# Patient Record
Sex: Female | Born: 1990 | Hispanic: Yes | Marital: Single
Health system: Southern US, Community
[De-identification: ages and names within clinical notes are randomized; demographics above are authoritative.]

---
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
## 2009-02-07 ENCOUNTER — Inpatient Hospital Stay: Payer: Self-pay | Admitting: Obstetrics and Gynecology

## 2010-11-26 ENCOUNTER — Ambulatory Visit: Payer: Self-pay | Admitting: Advanced Practice Midwife

## 2011-05-26 ENCOUNTER — Inpatient Hospital Stay: Payer: Self-pay | Admitting: Obstetrics and Gynecology

## 2011-05-26 LAB — CBC WITH DIFFERENTIAL/PLATELET
Basophil %: 0.2 %
Eosinophil %: 0.2 %
HCT: 38.7 % (ref 35.0–47.0)
HGB: 13.1 g/dL (ref 12.0–16.0)
Lymphocyte %: 17.5 %
Monocyte %: 6.1 %
Neutrophil #: 6.3 10*3/uL (ref 1.4–6.5)
RBC: 4.39 10*6/uL (ref 3.80–5.20)
WBC: 8.3 10*3/uL (ref 3.6–11.0)

## 2011-05-27 LAB — HEMATOCRIT: HCT: 39.8 % (ref 35.0–47.0)

## 2011-06-06 IMAGING — US US OB US >=[ID] SNGL FETUS
1 series · 17 of 28 positions shown · non-contrast
Comparison: none

REASON FOR EXAM: dates
COMMENTS:

[Series 1: us ob us >=(id) sngl fetus · 17 of 48 slices shown]
[im 1/48]
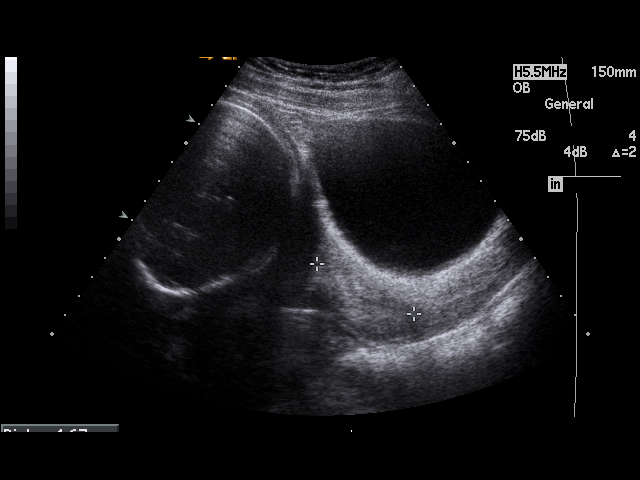
[im 4/48]
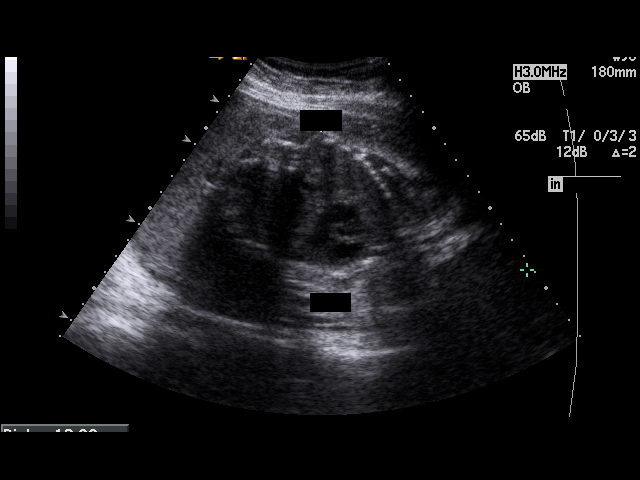
[im 7/48]
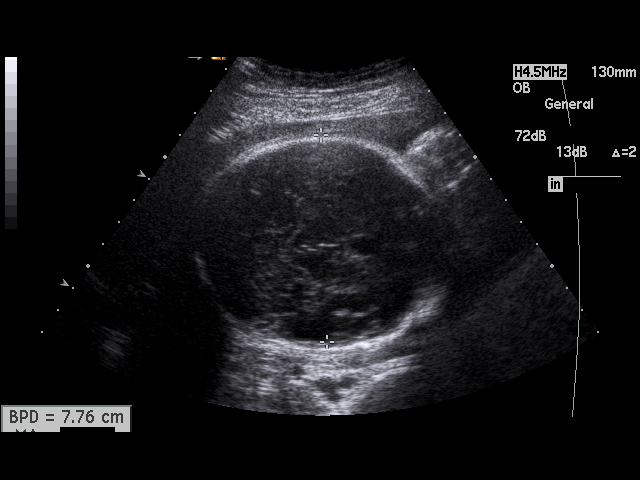
[im 9/48]
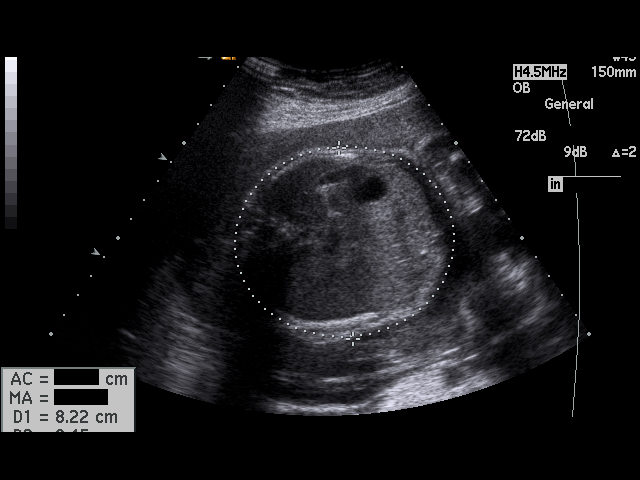
[im 13/48]
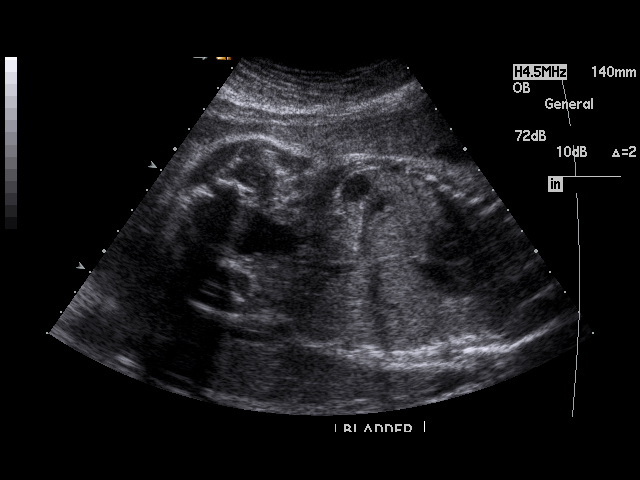
[im 16/48]
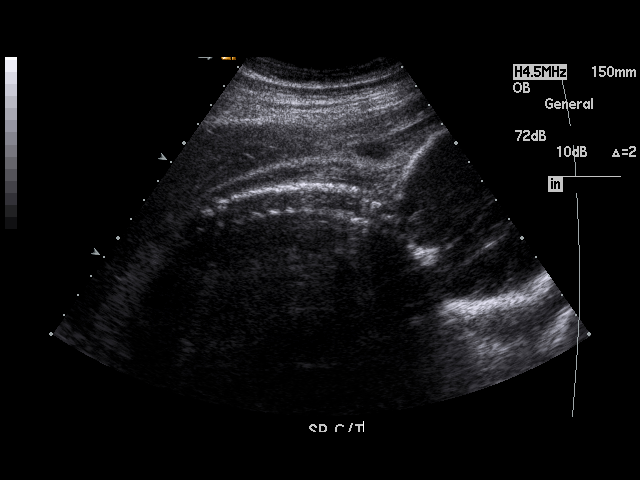
[im 18/48]
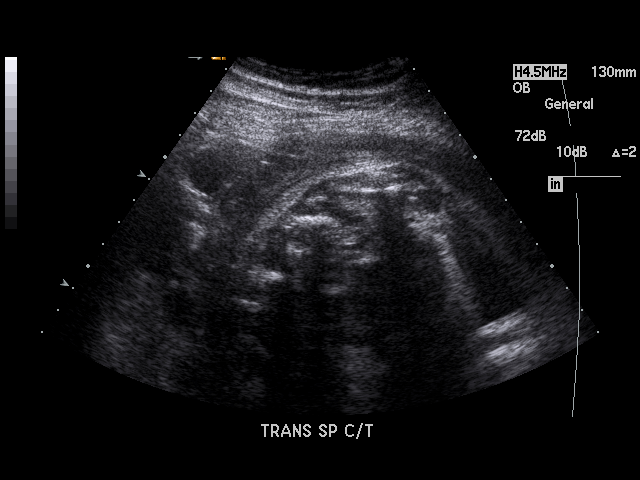
[im 21/48]
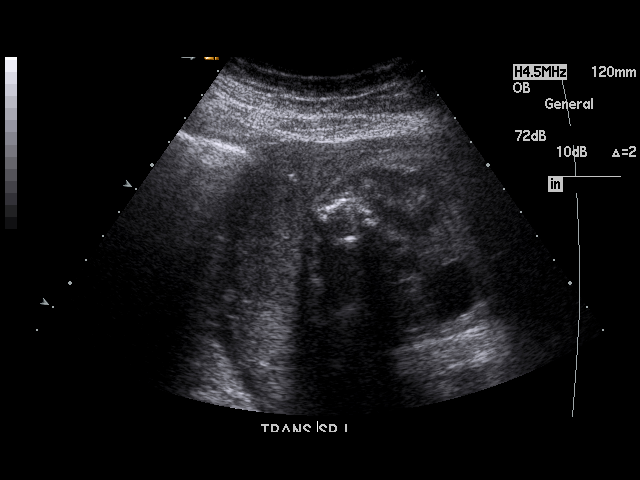
[im 25/48]
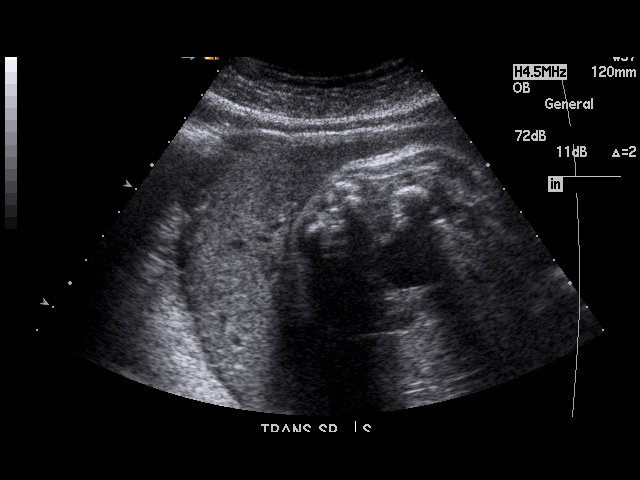
[im 27/48]
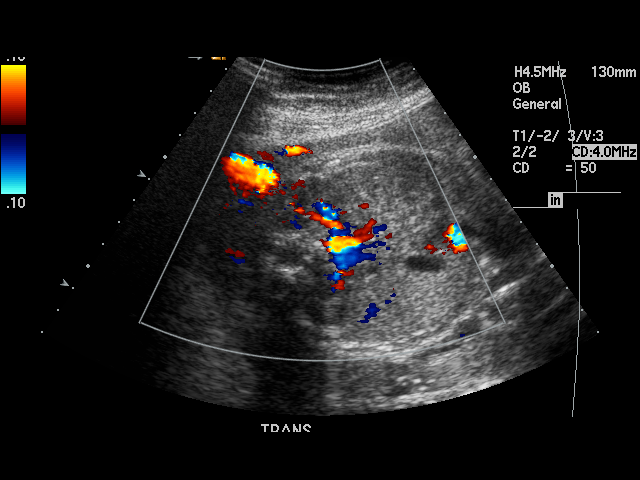
[im 30/48]
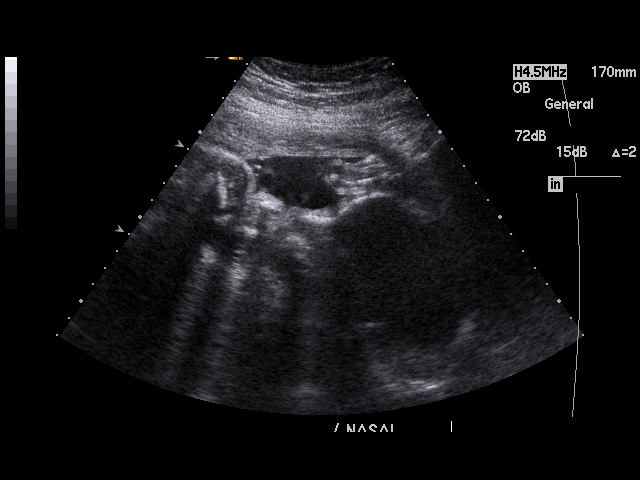
[im 32/48]
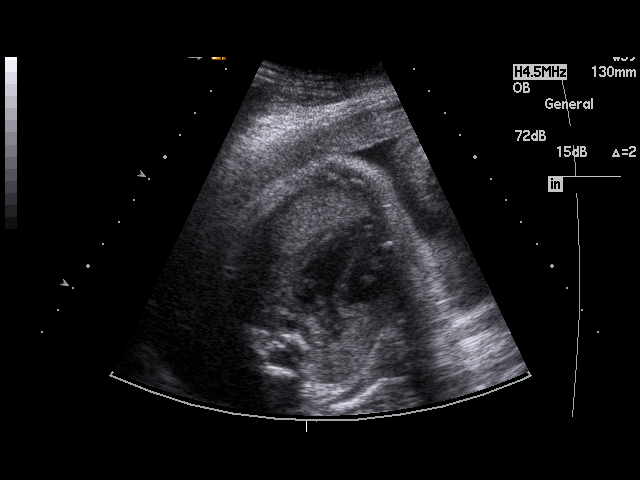
[im 35/48]
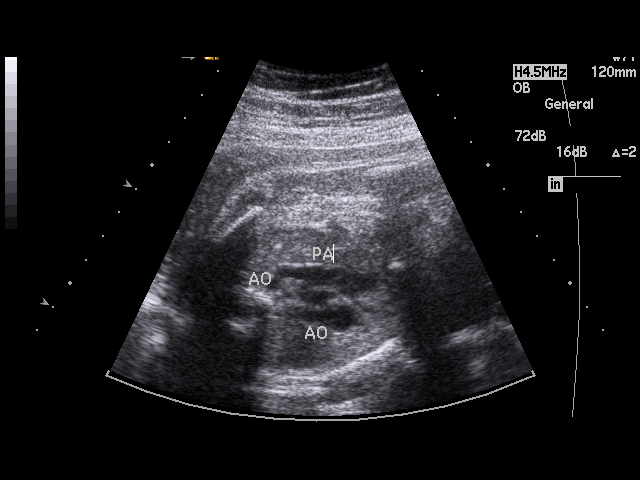
[im 39/48]
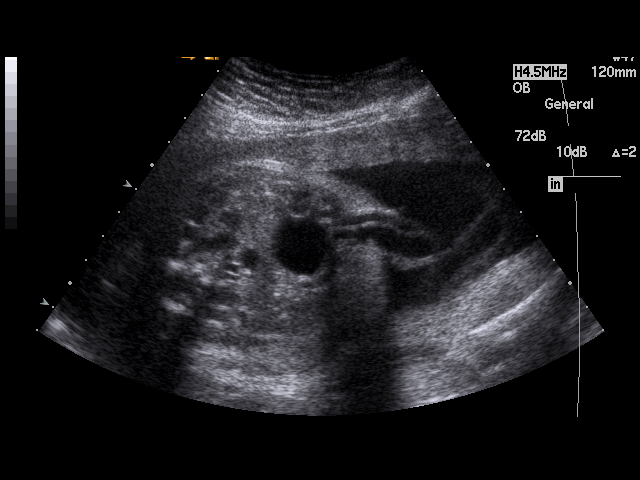
[im 41/48]
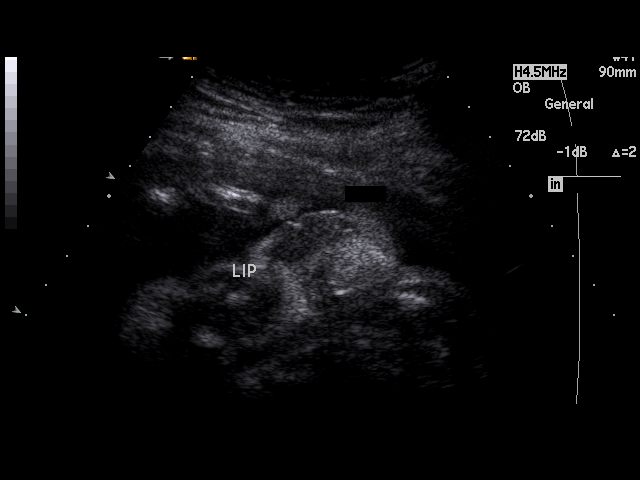
[im 44/48]
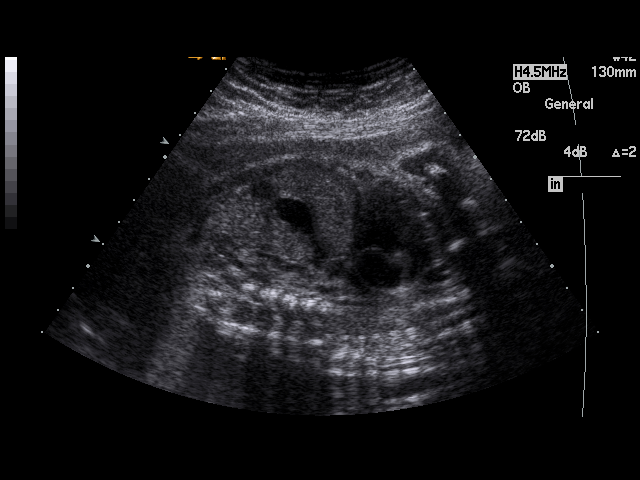
[im 48/48]
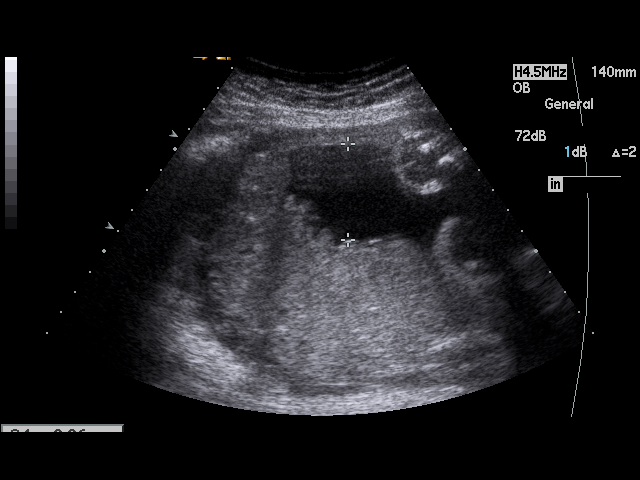

[17 of 28 positions shown; findings below may reference images not displayed]

PROCEDURE:     US  - US OB GREATER/OR EQUAL TO 4UH5G  - December 12, 2008 [DATE]

RESULT:     There is observed a single living intrauterine gestation.
Presentation currently is cephalic. Fetal heart rate was monitored at 150
beats per minute. Amnionic fluid volume appears normal. The placenta is
fundal in location with the inferior margin being approximately 7.5 cm above
the cervix. Cervix length measures 4.67 cm. The fetal heart, stomach, and
urinary bladder are visualized. No hydrocephalus or hydronephrosis is seen.
No fetal abnormalities are observed. Fetal measurements are as follows:

BPD: 7.74 cm corresponding to 31 weeks-5 days
HC: 27.77 cm corresponding to 30 weeks-2 days
AC: 27.14 cm corresponding to 31 weeks-4 days
FL: 6.3 cm corresponding to 32 weeks-4 days

EFW is 1,782 grams. AFI is 12.03 cm which is fairly near the 50th
percentile. Average ultrasound age is 31 weeks-4 days. Ultrasound EDD based
on today's measurements is February 11, 2009.
IMPRESSION: 1.     Please see above.

## 2014-09-02 NOTE — H&P (Signed)
L&D Evaluation:  History Expanded:   HPI 24 yo G2P1001, EDD of 05/22/11 per 14 week US, 6971w4d today. Presents with c/o contractions. Denies LOF or VB. + FM. Cervix 5 cm per RN. PNC at [facility] notable for late entry to care at 15 weeks and GBS in urine. SVD in 2010, "elevated glucose" during pregnancy. Labs: A+, antibody neg, RI, VI, HBsAg neg, HIV NR, RPR NR, GC/CT neg, 1 hour 131    Blood Type A positive    Group B Strep Results (Result >5wks must be treated as unknown) positive  in urine    Maternal HIV Negative    Maternal Syphilis Ab Nonreactive    Maternal Varicella Immune    Rubella Results immune    Maternal T-Dap Immune    Patient's Medical History No Chronic Illness  SVD 02/06/09 6#14oz, no complications    Patient's Surgical History none    Medications Pre Natal Vitamins    Allergies NKDA    Social History none    Family History Non-Contributory   ROS:   ROS see HPI   Exam:   Vital Signs 133/87    General no apparent distress    Mental Status clear    Chest clear    Heart no murmur/gallop/rubs    Abdomen gravid, tender with contractions    Estimated Fetal Weight Average for gestational age    Fetal Position vertex    Edema no edema    Reflexes 2+    Pelvic no external lesions, 5 cm per RN    Mebranes Intact    FHT normal rate with no decels    Ucx regular    Skin dry   Impression:   Impression active labor   Plan:   Plan EFM/NST, antibiotics for GBBS prophylaxis    Comments EFM Category I, pt requesting to ambulate in hallways, will monitor at least 15 min q hour. May have epidural as requested.   Electronic Signatures: Vella KohlerBrothers, Harold Mattes K (CNM)  (Signed 31-Jan-13 12:41)  Authored: L&D Evaluation   Last Updated: 31-Jan-13 12:41 by Vella KohlerBrothers, Tressie Ragin K (CNM)

## 2019-12-09 ENCOUNTER — Ambulatory Visit: Payer: Self-pay | Attending: Internal Medicine

## 2019-12-09 DIAGNOSIS — Z23 Encounter for immunization: Secondary | ICD-10-CM

## 2019-12-09 NOTE — Progress Notes (Signed)
   Covid-19 Vaccination Clinic  Name:  Patricia Vargas    MRN: [id number] DOB: 09-07-1990  12/09/2019  [name] was observed post Covid-19 immunization for 30 minutes based on pre-vaccination screening without incident. She was provided with Vaccine Information Sheet and instruction to access the V-Safe system.   [name] was instructed to call 911 with any severe reactions post vaccine: Marland Kitchen Difficulty breathing  . Swelling of face and throat  . A fast heartbeat  . A bad rash all over body  . Dizziness and weakness   Immunizations Administered    Name Date Dose VIS Date Route   Pfizer COVID-19 Vaccine 12/09/2019  9:45 AM 0.3 mL 06/19/2018 Intramuscular   Manufacturer: [employer], [employer]   Lot: [id number]   NDC: [address]-2

## 2020-01-06 ENCOUNTER — Ambulatory Visit: Payer: Self-pay

## 2020-01-06 ENCOUNTER — Ambulatory Visit: Payer: Medicaid Other | Attending: Internal Medicine

## 2020-01-06 DIAGNOSIS — Z23 Encounter for immunization: Secondary | ICD-10-CM

## 2020-01-06 NOTE — Progress Notes (Signed)
   Covid-19 Vaccination Clinic  Name:  Taitum Menton    MRN: [id number] DOB: 1991-03-24  01/06/2020  Ms. Dicke was observed post Covid-19 immunization for 15 minutes without incident. She was provided with Vaccine Information Sheet and instruction to access the V-Safe system.   Ms. Laham was instructed to call 911 with any severe reactions post vaccine: Marland Kitchen Difficulty breathing  . Swelling of face and throat  . A fast heartbeat  . A bad rash all over body  . Dizziness and weakness   Immunizations Administered    Name Date Dose VIS Date Route   Pfizer COVID-19 Vaccine 01/06/2020  9:43 AM 0.3 mL 06/19/2018 Intramuscular   Manufacturer: [employer], [employer]   Lot: [id number]   NDC: [address]-6

## 2022-07-20 ENCOUNTER — Emergency Department
Admission: EM | Admit: 2022-07-20 | Discharge: 2022-07-20 | Disposition: A | Discharge status: Home / Self Care | Payer: Self-pay | Attending: Emergency Medicine | Admitting: Emergency Medicine

## 2022-07-20 ENCOUNTER — Emergency Department: Payer: Self-pay

## 2022-07-20 ENCOUNTER — Other Ambulatory Visit: Payer: Self-pay

## 2022-07-20 DIAGNOSIS — Z20822 Contact with and (suspected) exposure to covid-19: Secondary | ICD-10-CM | POA: Insufficient documentation

## 2022-07-20 DIAGNOSIS — R0981 Nasal congestion: Secondary | ICD-10-CM | POA: Insufficient documentation

## 2022-07-20 DIAGNOSIS — T782XXA Anaphylactic shock, unspecified, initial encounter: Secondary | ICD-10-CM | POA: Insufficient documentation

## 2022-07-20 DIAGNOSIS — R000 Tachycardia, unspecified: Secondary | ICD-10-CM | POA: Insufficient documentation

## 2022-07-20 DIAGNOSIS — R0602 Shortness of breath: Secondary | ICD-10-CM

## 2022-07-20 LAB — TROPONIN I (HIGH SENSITIVITY)
Troponin I (High Sensitivity): 4 ng/L (ref <18)
Troponin I (High Sensitivity): 12 ng/L (ref <18)

## 2022-07-20 LAB — RESP PANEL BY RT-PCR (RSV, FLU A&B, COVID)  RVPGX2
Influenza A by PCR: NEGATIVE
Influenza B by PCR: NEGATIVE
Resp Syncytial Virus by PCR: NEGATIVE
SARS Coronavirus 2 by RT PCR: NEGATIVE

## 2022-07-20 LAB — CBC WITH DIFFERENTIAL/PLATELET
Abs Immature Granulocytes: 0.03 10*3/uL (ref 0.00–0.07)
Basophils Absolute: 0.1 10*3/uL (ref 0.0–0.1)
Basophils Relative: 1 %
Eosinophils Absolute: 0.9 10*3/uL — ABNORMAL HIGH (ref 0.0–0.5)
Eosinophils Relative: 8 %
HCT: 42.7 % (ref 36.0–46.0)
Hemoglobin: 14.1 g/dL (ref 12.0–15.0)
Immature Granulocytes: 0 %
Lymphocytes Relative: 32 %
Lymphs Abs: 3.9 10*3/uL (ref 0.7–4.0)
MCH: 29.4 pg (ref 26.0–34.0)
MCHC: 33 g/dL (ref 30.0–36.0)
MCV: 89 fL (ref 80.0–100.0)
Monocytes Absolute: 0.6 10*3/uL (ref 0.1–1.0)
Monocytes Relative: 5 %
Neutro Abs: 6.7 10*3/uL (ref 1.7–7.7)
Neutrophils Relative %: 54 %
Platelets: 359 10*3/uL (ref 150–400)
RBC: 4.8 MIL/uL (ref 3.87–5.11)
RDW: 12.9 % (ref 11.5–15.5)
WBC: 12.2 10*3/uL — ABNORMAL HIGH (ref 4.0–10.5)
nRBC: 0 % (ref 0.0–0.2)

## 2022-07-20 LAB — COMPREHENSIVE METABOLIC PANEL
ALT: 207 U/L — ABNORMAL HIGH (ref 0–44)
AST: 112 U/L — ABNORMAL HIGH (ref 15–41)
Albumin: 4 g/dL (ref 3.5–5.0)
Alkaline Phosphatase: 63 U/L (ref 38–126)
Anion gap: 14 (ref 5–15)
BUN: 19 mg/dL (ref 6–20)
CO2: 25 mmol/L (ref 22–32)
Calcium: 9.7 mg/dL (ref 8.9–10.3)
Chloride: 99 mmol/L (ref 98–111)
Creatinine, Ser: 1.05 mg/dL — ABNORMAL HIGH (ref 0.44–1.00)
GFR, Estimated: >60 mL/min (ref >60)
Glucose, Bld: 158 mg/dL — ABNORMAL HIGH (ref 70–99)
Potassium: 3.3 mmol/L — ABNORMAL LOW (ref 3.5–5.1)
Sodium: 138 mmol/L (ref 135–145)
Total Bilirubin: 0.7 mg/dL (ref 0.3–1.2)
Total Protein: 7.7 g/dL (ref 6.5–8.1)

## 2022-07-20 LAB — D-DIMER, QUANTITATIVE: D-Dimer, Quant: 1.44 ug/mL-FEU — ABNORMAL HIGH (ref 0.00–0.50)

## 2022-07-20 LAB — BRAIN NATRIURETIC PEPTIDE: B Natriuretic Peptide: 5.8 pg/mL (ref 0.0–100.0)

## 2022-07-20 MED ORDER — PREDNISONE 20 MG PO TABS: ORAL_TABLET | ORAL | 0 refills | Status: AC

## 2022-07-20 MED ORDER — EPINEPHRINE 0.3 MG/0.3ML IJ SOAJ
0.3000 mg | Freq: Once | INTRAMUSCULAR | Status: AC
  Administered 2022-07-20: 0.3 mg via INTRAMUSCULAR
  Filled 2022-07-20: qty 0.3

## 2022-07-20 MED ORDER — DIPHENHYDRAMINE HCL 50 MG/ML IJ SOLN
25.0000 mg | Freq: Once | INTRAMUSCULAR | Status: AC
  Administered 2022-07-20: 25 mg via INTRAVENOUS
  Filled 2022-07-20: qty 1

## 2022-07-20 MED ORDER — IOHEXOL 350 MG/ML SOLN
100.0000 mL | Freq: Once | INTRAVENOUS | Status: AC | PRN
  Administered 2022-07-20: 100 mL via INTRAVENOUS

## 2022-07-20 MED ORDER — FAMOTIDINE 20 MG PO TABS: 20.0000 mg | ORAL_TABLET | Freq: Two times a day (BID) | ORAL | 0 refills | Status: AC

## 2022-07-20 MED ORDER — SODIUM CHLORIDE 0.9 % IV BOLUS
1000.0000 mL | Freq: Once | INTRAVENOUS | Status: AC
  Administered 2022-07-20: 1000 mL via INTRAVENOUS

## 2022-07-20 MED ORDER — FAMOTIDINE IN NACL 20-0.9 MG/50ML-% IV SOLN
20.0000 mg | Freq: Once | INTRAVENOUS | Status: AC
  Administered 2022-07-20: 20 mg via INTRAVENOUS
  Filled 2022-07-20: qty 50

## 2022-07-20 MED ORDER — AZITHROMYCIN 250 MG PO TABS: ORAL_TABLET | ORAL | 0 refills | Status: AC

## 2022-07-20 MED ORDER — EPINEPHRINE 0.3 MG/0.3ML IJ SOAJ: 0.3000 mg | Freq: Once | INTRAMUSCULAR | 2 refills | Status: AC

## 2022-07-20 NOTE — ED Notes (Signed)
Pt ambulatory to restroom with assist from husband in room

## 2022-07-20 NOTE — ED Triage Notes (Signed)
Pt presents to ER via ems from home with c/o resp distress that started a few hours ago, but was getting worse.  Pt denies hx of asthma/COPD.  On arrival, ems states pt's O2 sats were in the 60's, and pt was seen tri-podding, and using accessory muscles.  Ems gave 125 mg of solu-medrol, 1 albuterol and 1 duoneb breathing tx PTA.  Pt states she has known allergy to advil, but took one tonight anyway.  Pt otherwise A&O x4 and in NAD.

## 2022-07-20 NOTE — Discharge Instructions (Addendum)
1. Take the following medicines for the next 4 days: Prednisone 60mg  daily Pepcid 20mg  twice daily 2. Take Benadryl as needed for itching. 3. Use Epi-Pen in case of acute, life-threatening allergic reaction. 4. Take Z-Pak as prescribed for sinus congestion. 5. Return to the ER for worsening symptoms, persistent vomiting, difficulty breathing or other concerns.

## 2022-07-20 NOTE — ED Provider Notes (Signed)
[facility] Provider Note    Event Date/Time   First MD Initiated Contact with Patient 07/20/22 0023     (approximate)   History   Shortness of Breath   HPI  Patricia Vargas is a 32 y.o. female brought to the ED via EMS from home with a chief complaint of respiratory distress and allergic reaction.  Despite a previous similar allergic reaction, patient took ibuprofen for sinus congestion and subsequently began to have increasing difficulty breathing.  Denies rash or hives.  Feels like throat constriction.  EMS reports patient's room air saturations were in the 60% upon their arrival and patient was tripoding and using accessory muscles.  EMS gave 125 mg IV Solu-Medrol, 1 albuterol and 1 DuoNeb prior to arrival.  Patient denies fever/chills, cough, chest pain, abdominal pain, nausea/vomiting/diarrhea.     Past Medical History  No past medical history on file. No prior history of asthma or COPD  Active Problem List  There are no problems to display for this patient.    Past Surgical History  None   Home Medications   Prior to Admission medications   Medication Sig Start Date End Date Taking? Authorizing Provider  azithromycin (ZITHROMAX Z-PAK) 250 MG tablet Take 2 PO day #1, then 1 PO daily until finished 07/20/22  Yes Paulette Blanch, MD  EPINEPHrine 0.3 mg/0.3 mL IJ SOAJ injection Inject 0.3 mg into the muscle once for 1 dose. 07/20/22 07/20/22 Yes Paulette Blanch, MD  famotidine (PEPCID) 20 MG tablet Take 1 tablet (20 mg total) by mouth 2 (two) times daily. 07/20/22  Yes Paulette Blanch, MD  predniSONE (DELTASONE) 20 MG tablet 3 tablets daily x 4 days 07/20/22  Yes Paulette Blanch, MD     Allergies  Advil [ibuprofen]   Family History  No family history on file.   Physical Exam  Triage Vital Signs: ED Triage Vitals  Enc Vitals Group     BP 07/20/22 0027 (!) 154/108     Pulse Rate 07/20/22 0027 (!) 122     Resp 07/20/22 0027 (!) 24     Temp --       Temp src --      SpO2 07/20/22 0027 97 %     Weight 07/20/22 0028 258 lb (117 kg)     Height 07/20/22 0028 5\' 5"  (1.651 m)     Head Circumference --      Peak Flow --      Pain Score 07/20/22 0028 0     Pain Loc --      Pain Edu? --      Excl. in [place]? --     Updated Vital Signs: BP (!) 158/96   Pulse (!) 122   Temp 97.7 F (36.5 C) (Oral)   Resp (!) 25   Ht 5\' 5"  (1.651 m)   Wt 117 kg   SpO2 100%   BMI 42.93 kg/m    General: Awake, mild to moderate distress.  CV:  Tachycardic.  Good peripheral perfusion.  Resp:  Increased effort.  Mild wheezing. Abd:  Nontender.  No distention.  Other:  No angioedema.  Posterior oropharynx is clear.  Uvula midline.  No tonsillar swelling, exudates or peritonsillar abscess.  There is no hoarse or muffled voice.  There is no drooling.  Tolerating secretions well.  No palpable neck mass.  No urticaria.   ED Results / Procedures / Treatments  Labs (all labs ordered are listed, but only  abnormal results are displayed) Labs Reviewed  CBC WITH DIFFERENTIAL/PLATELET - Abnormal; Notable for the following components:      Result Value   WBC 12.2 (*)    Eosinophils Absolute 0.9 (*)    All other components within normal limits  COMPREHENSIVE METABOLIC PANEL - Abnormal; Notable for the following components:   Potassium 3.3 (*)    Glucose, Bld 158 (*)    Creatinine, Ser 1.05 (*)    AST 112 (*)    ALT 207 (*)    All other components within normal limits  D-DIMER, QUANTITATIVE - Abnormal; Notable for the following components:   D-Dimer, Quant 1.44 (*)    All other components within normal limits  RESP PANEL BY RT-PCR (RSV, FLU A&B, COVID)  [id number]  BRAIN NATRIURETIC PEPTIDE  TROPONIN I (HIGH SENSITIVITY)  TROPONIN I (HIGH SENSITIVITY)     EKG  ED ECG REPORT I, Jamee Keach J, the attending physician, personally viewed and interpreted this ECG.   Date: 07/20/2022  EKG Time: 0121  Rate: 125  Rhythm: sinus tachycardia  Axis: Normal   Intervals:none  ST&T Change: Nonspecific    RADIOLOGY I have independently visualized and interpreted patient's chest x-ray as well as noted the radiology interpretation:  X-ray: No acute cardiopulmonary process  CTA chest: No PE  Official radiology report(s): CT Angio Chest PE W/Cm &/Or Wo Cm  Result Date: 07/20/2022 CLINICAL DATA:  Low to intermediate probability for pulmonary embolism. Positive D-dimer. Respiratory distress, worsening EXAM: CT ANGIOGRAPHY CHEST WITH CONTRAST TECHNIQUE: Multidetector CT imaging of the chest was performed using the standard protocol during bolus administration of intravenous contrast. Multiplanar CT image reconstructions and MIPs were obtained to evaluate the vascular anatomy. RADIATION DOSE REDUCTION: This exam was performed according to the departmental dose-optimization program which includes automated exposure control, adjustment of the mA and/or kV according to patient size and/or use of iterative reconstruction technique. CONTRAST:  125mL OMNIPAQUE IOHEXOL 350 MG/ML SOLN COMPARISON:  None Available. FINDINGS: Cardiovascular: Satisfactory opacification of the pulmonary arteries to the segmental level. No evidence of pulmonary embolism. Normal heart size. No pericardial effusion. Mediastinum/Nodes: Negative for mass or adenopathy. Lungs/Pleura: Lungs are clear. No pleural effusion or pneumothorax. Mild generalized airway thickening. Upper Abdomen: No acute or aggressive finding Musculoskeletal: Thoracic scoliosis. No acute or aggressive finding. Review of the MIP images confirms the above findings. IMPRESSION: 1. Negative for pulmonary embolism or pneumonia. 2. Mild generalized airway thickening. Electronically Signed   By: Jorje Guild M.D.   On: 07/20/2022 06:49   DG Chest Port 1 View  Result Date: 07/20/2022 CLINICAL DATA:  sob EXAM: PORTABLE CHEST 1 VIEW COMPARISON:  None Available. FINDINGS: The heart and mediastinal contours are within normal limits.  No focal consolidation. No pulmonary edema. No pleural effusion. No pneumothorax. No acute osseous abnormality. IMPRESSION: No active disease. Electronically Signed   By: Iven Finn M.D.   On: 07/20/2022 01:11     PROCEDURES:  Critical Care performed: Yes, see critical care procedure note(s)  CRITICAL CARE Performed by: Paulette Blanch   Total critical care time: 45 minutes  Critical care time was exclusive of separately billable procedures and treating other patients.  Critical care was necessary to treat or prevent imminent or life-threatening deterioration.  Critical care was time spent personally by me on the following activities: development of treatment plan with patient and/or surrogate as well as nursing, discussions with consultants, evaluation of patient's response to treatment, examination of patient, obtaining history from patient or surrogate, ordering  and performing treatments and interventions, ordering and review of laboratory studies, ordering and review of radiographic studies, pulse oximetry and re-evaluation of patient's condition.   Marland Kitchen1-3 Lead EKG Interpretation  Performed by: Paulette Blanch, MD Authorized by: Paulette Blanch, MD     Interpretation: abnormal     ECG rate:  120   ECG rate assessment: tachycardic     Rhythm: sinus tachycardia     Ectopy: none     Conduction: normal      MEDICATIONS ORDERED IN ED: Medications  sodium chloride 0.9 % bolus 1,000 mL (0 mLs Intravenous Stopped 07/20/22 0150)  EPINEPHrine (EPI-PEN) injection 0.3 mg (0.3 mg Intramuscular Given 07/20/22 0035)  famotidine (PEPCID) IVPB 20 mg premix (0 mg Intravenous Stopped 07/20/22 0117)  diphenhydrAMINE (BENADRYL) injection 25 mg (25 mg Intravenous Given 07/20/22 0037)  sodium chloride 0.9 % bolus 1,000 mL (0 mLs Intravenous Stopped 07/20/22 0449)  iohexol (OMNIPAQUE) 350 MG/ML injection 100 mL (100 mLs Intravenous Contrast Given 07/20/22 0608)     IMPRESSION / MDM / ASSESSMENT AND PLAN /  ED COURSE  I reviewed the triage vital signs and the nursing notes.                             32 year old female presenting with shortness of breath. Differential includes, but is not limited to, viral syndrome, bronchitis including COPD exacerbation, pneumonia, reactive airway disease including asthma, CHF including exacerbation with or without pulmonary/interstitial edema, pneumothorax, ACS, thoracic trauma, and pulmonary embolism.  I have personally reviewed patient's records and note immunization encounters from 2021.  Patient's presentation is most consistent with acute presentation with potential threat to life or bodily function.  The patient is on the cardiac monitor to evaluate for evidence of arrhythmia and/or significant heart rate changes.  Patient most likely having anaphylactic reaction to ibuprofen.  Will administer IV allergic reaction cocktail to include Solu-Medrol, Benadryl, Pepcid and IM epi.  Will obtain basic blood work and chest x-ray.  Will continue to monitor and reassess.  Room air saturation 97% currently.  Clinical Course as of 07/20/22 0655  Wed Jul 20, 2022  0303 Patient is feeling significantly better.  No complaints of breathing difficulty.  No wheezing on reexamination, no tachypnea.  Updated patient of all laboratory and imaging results.  Heart rate remains in the 130s, will administer second liter IV fluids.  Oropharynx is clear.  Phonation normal.  Room air saturation 100%. [JS]  0421 HR improved; currently 120.  Given persistent tachycardia, will check temperature and D-dimer.  [JS]  [id number] D-dimer is elevated.  Will obtain CTA chest to evaluate for PE.  Updated patient who tells me she has been taking over-the-counter decongestants for her sinus congestion.  Additionally she has been drinking detox tea and her doctor started her on phentermine recently.  Cautioned patient against taking all of those agents as they are most likely contributing to her tachycardia.  [JS]  [id number] CTA negative for PE.  Will discharge home on Z-Pak and 5-day prednisone as well as Pepcid.  Will write for as needed for EpiPen.  Strict return precautions given.  Patient verbalizes understanding and agrees with plan of care. [JS]    Clinical Course User Index [JS] Paulette Blanch, MD     FINAL CLINICAL IMPRESSION(S) / ED DIAGNOSES   Final diagnoses:  Shortness of breath  Anaphylaxis, initial encounter  Sinus congestion     Rx / DC Orders  ED Discharge Orders          Ordered    predniSONE (DELTASONE) 20 MG tablet        07/20/22 0615    famotidine (PEPCID) 20 MG tablet  2 times daily        07/20/22 0615    EPINEPHrine 0.3 mg/0.3 mL IJ SOAJ injection   Once        07/20/22 0615    azithromycin (ZITHROMAX Z-PAK) 250 MG tablet        07/20/22 0630             Note:  This document was prepared using Dragon voice recognition software and may include unintentional dictation errors.   Paulette Blanch, MD 07/20/22 [phone]
